# Patient Record
Sex: Male | Born: 1950 | Race: White | Hispanic: No | Marital: Married | Smoking: Never smoker
Health system: Southern US, Community
[De-identification: ages and names within clinical notes are randomized; demographics above are authoritative.]

## PROBLEM LIST (undated history)

## (undated) DIAGNOSIS — I10 Essential (primary) hypertension: Secondary | ICD-10-CM

## (undated) DIAGNOSIS — K219 Gastro-esophageal reflux disease without esophagitis: Secondary | ICD-10-CM

## (undated) DIAGNOSIS — E785 Hyperlipidemia, unspecified: Secondary | ICD-10-CM

## (undated) DIAGNOSIS — E669 Obesity, unspecified: Secondary | ICD-10-CM

## (undated) HISTORY — DX: Gastro-esophageal reflux disease without esophagitis: K21.9

## (undated) HISTORY — DX: Obesity, unspecified: E66.9

## (undated) HISTORY — DX: Hyperlipidemia, unspecified: E78.5

## (undated) HISTORY — DX: Essential (primary) hypertension: I10

## (undated) HISTORY — PX: UMBILICAL HERNIA REPAIR: SHX196

---

## 1975-01-11 HISTORY — PX: LAMINECTOMY: SHX219

## 2010-01-10 HISTORY — PX: COLONOSCOPY: SHX174

## 2011-06-05 ENCOUNTER — Observation Stay: Payer: Self-pay | Admitting: Specialist

## 2011-06-05 LAB — COMPREHENSIVE METABOLIC PANEL
Albumin: 3.9 g/dL (ref 3.4–5.0)
Alkaline Phosphatase: 84 U/L (ref 50–136)
Bilirubin,Total: 0.6 mg/dL (ref 0.2–1.0)
EGFR (African American): >60
EGFR (Non-African Amer.): >60
Osmolality: 283 (ref 275–301)
SGOT(AST): 22 U/L (ref 15–37)
Sodium: 141 mmol/L (ref 136–145)
Total Protein: 7.2 g/dL (ref 6.4–8.2)

## 2011-06-05 LAB — CK TOTAL AND CKMB (NOT AT ARMC)
CK, Total: 219 U/L (ref 35–232)
CK-MB: 1.4 ng/mL (ref 0.5–3.6)

## 2011-06-05 LAB — LIPASE, BLOOD: Lipase: 77 U/L (ref 73–393)

## 2011-06-05 LAB — CBC
HCT: 47.1 % (ref 40.0–52.0)
HGB: 15.6 g/dL (ref 13.0–18.0)
Platelet: 226 10*3/uL (ref 150–440)

## 2011-06-06 LAB — TROPONIN I: Troponin-I: <0.02 ng/mL

## 2013-04-02 ENCOUNTER — Ambulatory Visit: Payer: Self-pay | Admitting: Nurse Practitioner

## 2013-09-23 IMAGING — CT CT HEAD WITHOUT CONTRAST
2 series · 16 of 30 positions shown, 20 images · non-contrast
Comparison: none

REASON FOR EXAM: dizziness
COMMENTS:

PROCEDURE:     CT  - CT HEAD WITHOUT CONTRAST  - June 05, 2011  [DATE]
RESULT:     Comparison:  None
TECHNIQUE: Multiple axial images from the foramen magnum to the vertex were
obtained without IV contrast.

[Series 2: without · axial · non-contrast · 0.44mm/px · z∈[+182,+306]mm · 13 of 31 slices shown, 17 images]
[im 3/31  brain]
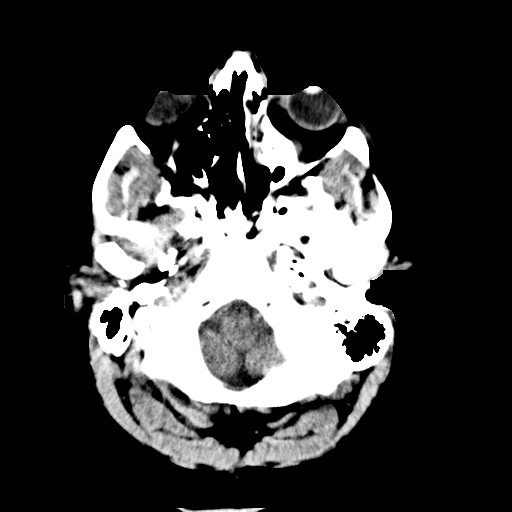
[im 3/31  bone]
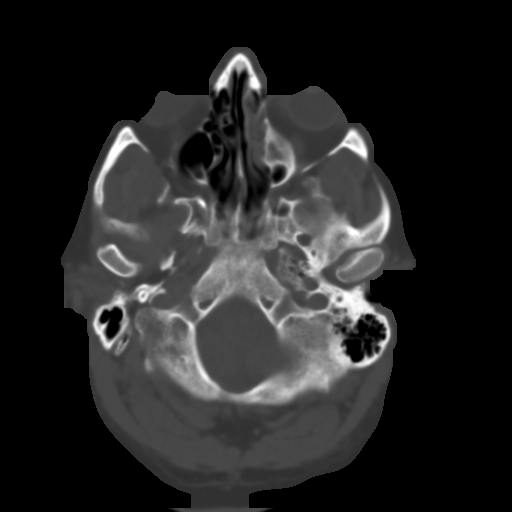
[im 5/31  brain]
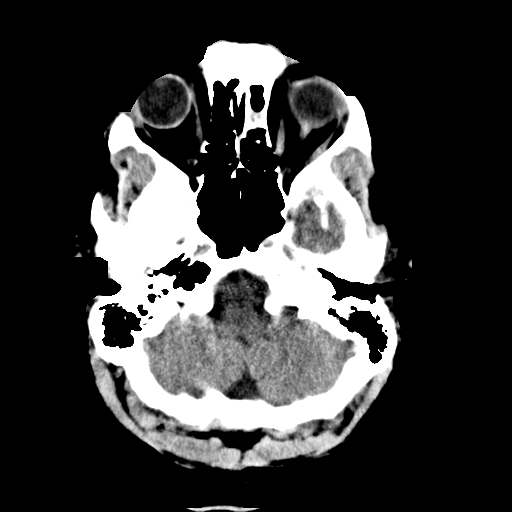
[im 7/31  brain]
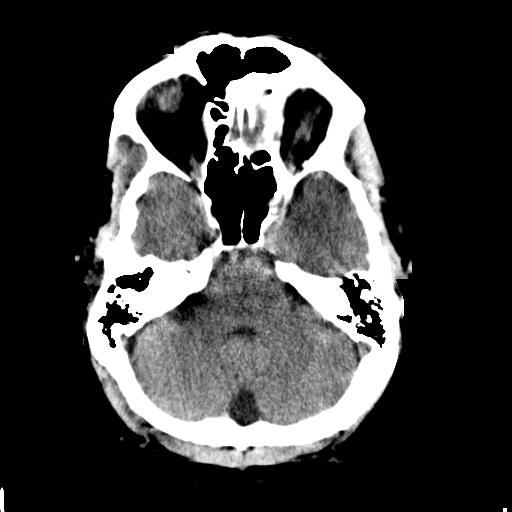
[im 9/31  brain]
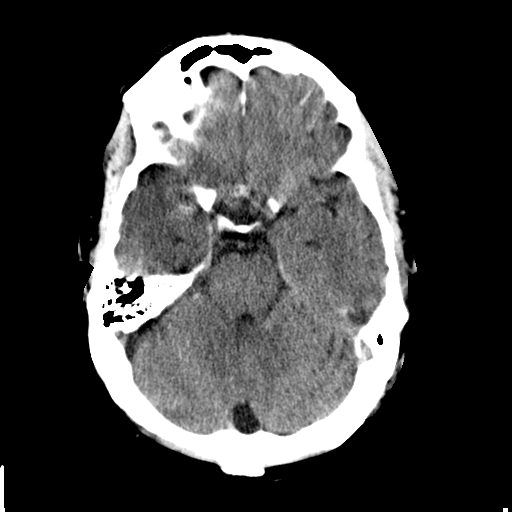
[im 11/31  brain]
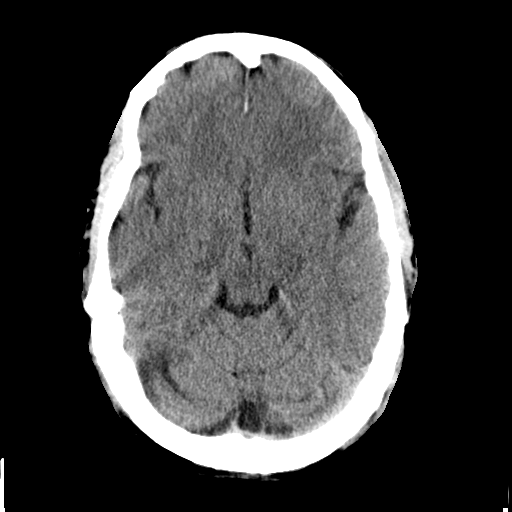
[im 11/31  bone]
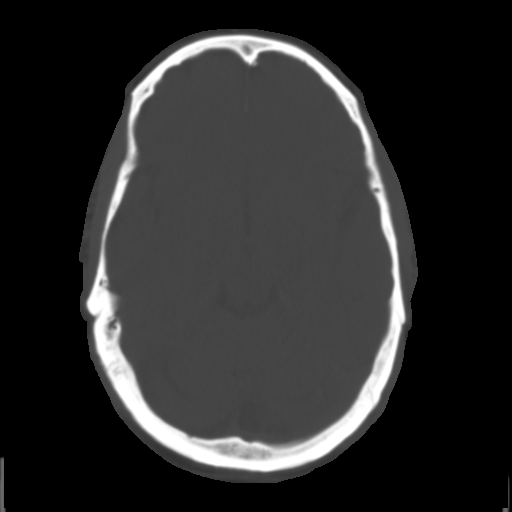
[im 13/31  brain]
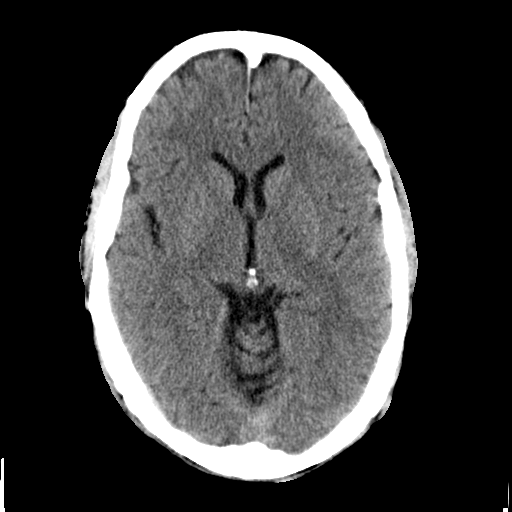
[im 16/31  brain]
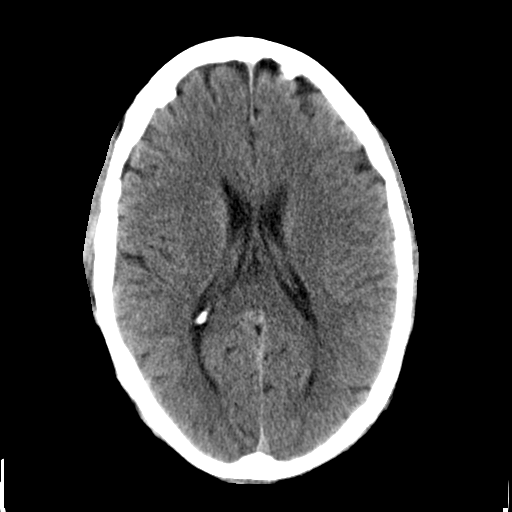
[im 18/31  brain]
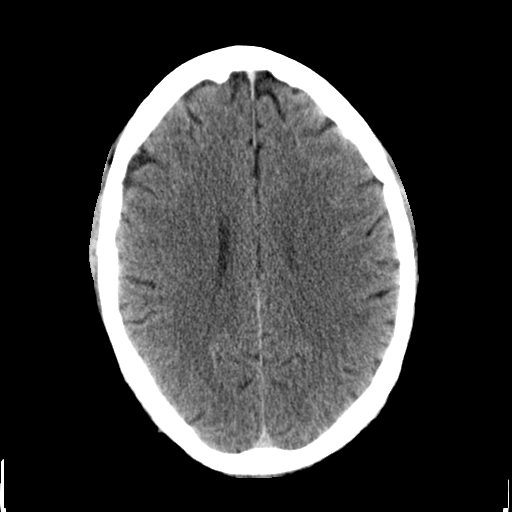
[im 20/31  brain]
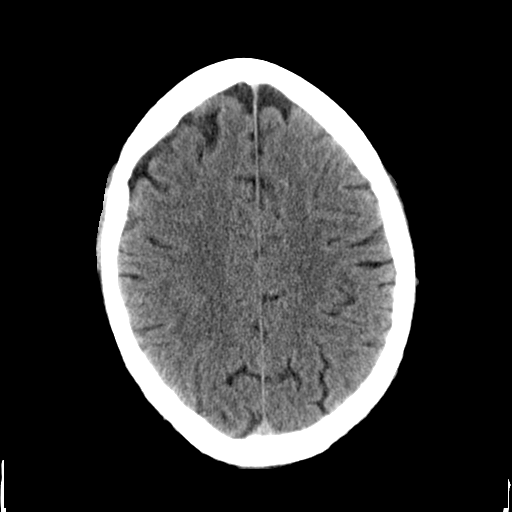
[im 20/31  bone]
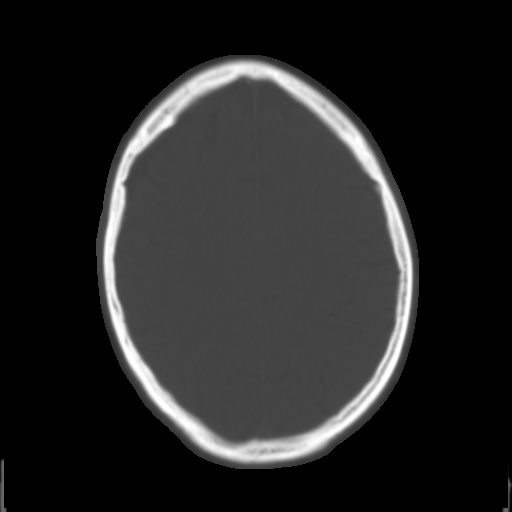
[im 22/31  brain]
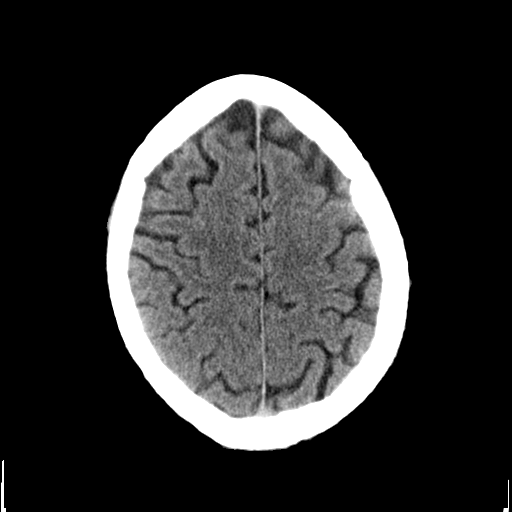
[im 24/31  brain]
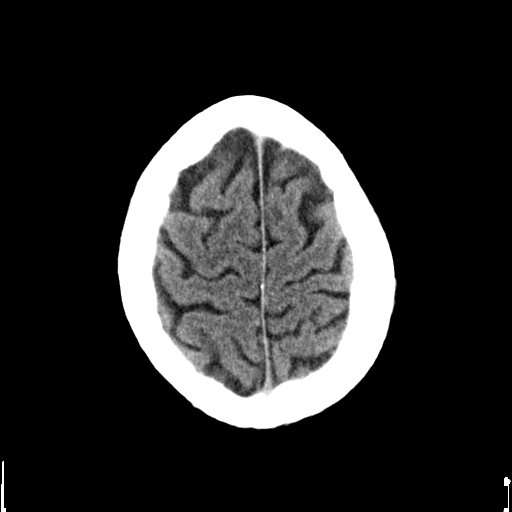
[im 26/31  brain]
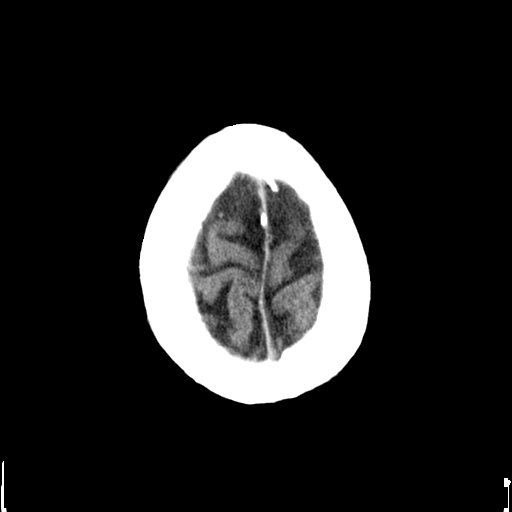
[im 28/31  brain]
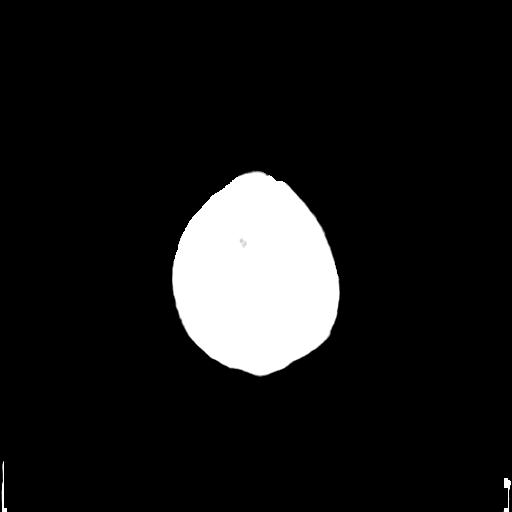
[im 28/31  bone]
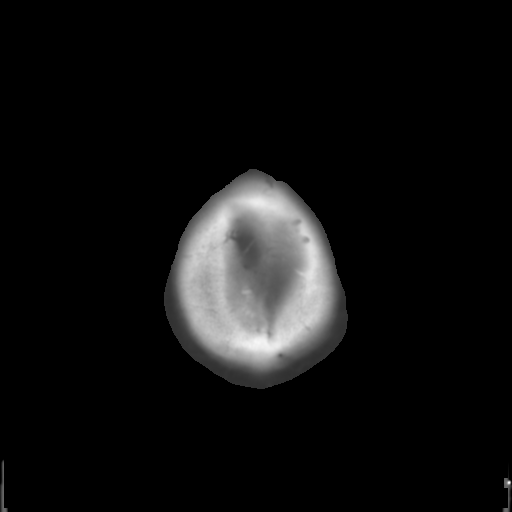

[Series 3: bone · axial · 0.44mm/px · z∈[+182,+222]mm · 3 of 31 slices shown]
[im 3/31  bone]
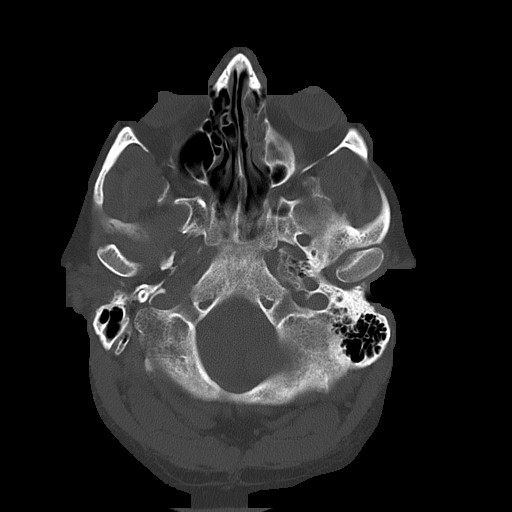
[im 7/31  bone]
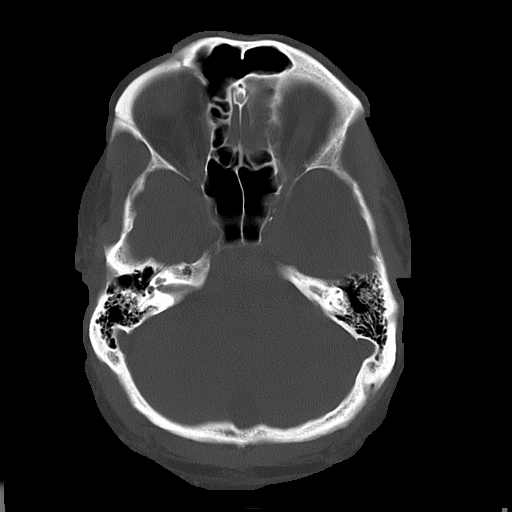
[im 11/31  bone]
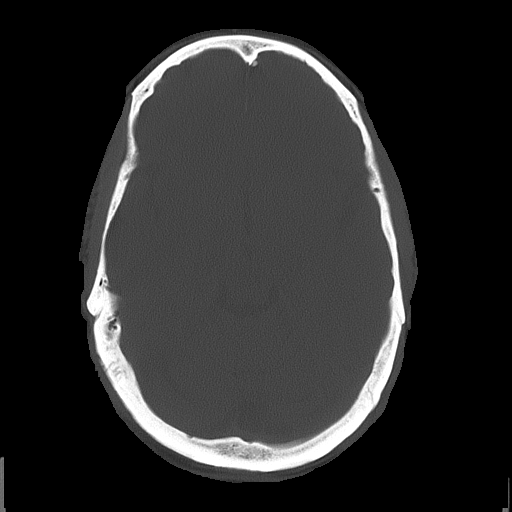

[16 of 30 positions shown; findings below may reference images not displayed]

FINDINGS: There is no evidence for mass effect, midline shift, or extra-axial fluid
collections. There is no evidence for space-occupying lesion, intracranial
hemorrhage, or cortical-based area of infarction.

There is near-complete opacification of the partially visualized left
maxillary sinus. There is medial deviation of the medial wall of the left
maxillary sinus. There is thickening of the walls of the left maxillary
sinus, incompletely visualized. There is mild opacification of the left
ethmoid air cells.

The osseous structures are unremarkable.
IMPRESSION: 1. No acute intracranial process.
2. Chronic left maxillary sinus disease.

## 2013-11-02 ENCOUNTER — Emergency Department: Payer: Self-pay | Admitting: Emergency Medicine

## 2014-05-04 NOTE — H&P (Signed)
PATIENT NAME:  Derek Martin, Derek Martin MR#:  [id number] DATE OF BIRTH:  10/16/1950  DATE OF ADMISSION:  06/05/2011  PRIMARY CARE PHYSICIAN: The patient has no local doctor.   CHIEF COMPLAINT: Near syncope, dizziness, and sweating.   HISTORY OF PRESENT ILLNESS: Derek Martin is a 64 year old pleasant Caucasian male with history of systemic hypertension. He moved from [place] to [place] in March. He was doing well until a few days ago on Thursday when he noticed that he has dizziness associated with sweating that comes and goes and some tingly sensation in the scalp area. This is also associated with near syncope feeling. The patient was noticed to have bradycardia while he is being monitored here. It was in the 40's and at times went down even in the 30's. The patient is on atenolol. The dose was not specified. The patient denies having any chest pain. No shortness of breath. No palpitations. Now being admitted for observation on telemetry.   REVIEW OF SYSTEMS: Denies having any fever. No chills. No fatigue. No other symptoms other than sweating and the dizziness. EYES: No blurring of vision. No double vision. ENT: No hearing impairment. No sore throat. No dysphagia. CARDIOVASCULAR: No chest pain. No shortness of breath. No edema but he has near syncope symptoms. RESPIRATORY: No shortness of breath. No cough. No sputum production. GASTROINTESTINAL: No abdominal pain. No nausea. No vomiting. No diarrhea. GENITOURINARY: No dysuria. No frequency of urination. MUSCULOSKELETAL: No joint pain or swelling. No muscular pain or swelling. INTEGUMENTARY: No skin rash. No ulcers. NEUROLOGY: No focal weakness. No seizure activity. No headache. No ataxia. PSYCHIATRY: No anxiety. No depression. ENDOCRINE: No heat or cold intolerance. No polyuria or polydipsia.   PAST MEDICAL HISTORY:  1. Systemic hypertension.  2. Hypercholesterolemia.   PAST SURGICAL HISTORY:  1. Umbilical hernia repair. 2. Back surgery.     ADMISSION MEDICATIONS:  1. Atenolol. The dose was not specified.  2. Triamterene 1 tablet once a day.   ALLERGIES: No known drug allergies.   SOCIAL HABITS: Nonsmoker. No history of drug or alcohol abuse. He drinks alcohol only rarely.   FAMILY HISTORY: His father died at age of 69 from myocardial infarction. His mother is still living and she has no health problems.   SOCIAL HISTORY: He is married, living with his wife. 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   PHYSICAL EXAMINATION:   VITAL SIGNS: Blood pressure 146/77, respiratory rate 20, pulse 56 and at times in the 30's, temperature 97.9, oxygen saturation 96%.   GENERAL APPEARANCE: Middle-aged male laying in bed in no acute distress, healthy looking.   EARS, NOSE, AND THROAT: Hearing was normal. Nasal mucosa, lips, tongue were normal.   EYES: Normal iris and conjunctivae. Pupils about 5 mm, equal and reactive to light.   NECK: Supple. Trachea at midline. No thyromegaly. No cervical lymphadenopathy. No masses.   HEART: Normal S1, S2. No S3 or S4. No murmur. No gallop. No carotid bruits.   RESPIRATORY: Normal breathing pattern without use of accessory muscles. No rales. No wheezing.   ABDOMEN: Soft without tenderness. No hepatosplenomegaly. No masses. No hernias.   SKIN: No ulcers. No subcutaneous nodules.   MUSCULOSKELETAL: No joint swelling. No clubbing.   NEUROLOGIC: Cranial nerves II through XII are intact. No focal motor deficit.   PSYCHIATRIC: The patient is alert and oriented x3. Mood and affect were normal.   LABORATORY, DIAGNOSTIC, AND RADIOLOGICAL DATA: CAT scan of the head showed no  acute abnormalities.   EKG showed sinus bradycardia at rate of 54 per minute with incomplete right bundle branch block, otherwise unremarkable EKG.  Serum glucose 101, BUN 16, creatinine 0.9, sodium 141, potassium 3.6. Liver function tests were normal. CPK 219. Troponin less than 0.02. CBC  showed white count of 11,000, hemoglobin 15, hematocrit 47, platelet count 226.   IMPRESSION:  1. Dizziness associated with near syncope episodes. 2. Significant bradycardia noted over telemetry to reach 30's but maintaining sinus rhythm.  3. Systemic hypertension, not optimally controlled.  4. Hypercholesterolemia.  5. History of back surgery. 6. Umbilical hernia repair.   PLAN:  1. Will admit the patient for observation over telemetry monitoring.  2. Discontinue atenolol. Will use lisinopril 10 mg a day instead.  3. Continue the diuretic.  4. Follow-up on cardiac enzymes.  5. I advised the patient to find a local primary care physician to follow-up but hopefully the cause of his symptoms is the atenolol. Should his symptoms persist, then we have to look for another cause of  his symptoms.   ____________________________ Clovis Pu. Lenore Manner, MD amd:drc D: 06/05/2011 22:02:28 ET T: 06/06/2011 07:01:09 ET JOB#: [id number]  cc: Clovis Pu. Lenore Manner, MD, <Dictator> Mike Craze Irven Coe MD ELECTRONICALLY SIGNED 06/11/2011 22:08

## 2014-05-04 NOTE — Consult Note (Signed)
Consult dictated. 89 YOWM came with dizziness and sinus bradycardia in 40S, and now has 56/min heart rate. He may have inner ear problem and started meclazine. Will d/c home with f/u office tomorrow 11 am.  Electronic Signatures: Angelica Ran (MD)  (Signed on 27-May-13 10:29)  Authored  Last Updated: 27-May-13 10:29 by Angelica Ran (MD)

## 2014-05-04 NOTE — Discharge Summary (Signed)
PATIENT NAME:  Derek Martin, Derek Martin MR#:  [id number] DATE OF BIRTH:  [birth year]  DATE OF ADMISSION:  06/05/2011 DATE OF DISCHARGE:  06/06/2011  HISTORY AND PHYSICAL: For a detailed note, please take a look at the History and Physical done on admission by Dr. Lenore Manner.   DIAGNOSES AT DISCHARGE:  1. Dizziness likely suspected due to underlying bradycardia from atenolol, complicated with possible underlying vertigo. 2. Sinus bradycardia, likely related to atenolol.  3. Hypertension.   DIET: The patient is being discharged on a low-sodium diet.   ACTIVITY: As tolerated.   FOLLOWUP: Follow up with Dr. Neoma Laming in the next 1 to 2 weeks.   DISCHARGE MEDICATIONS:  1. Triamterene HCTZ 37.5/25, 1 tab daily.  2. Aspirin 81 mg daily.  3. Lisinopril 10 mg daily.   NOTE: The patient is being told to discontinue his atenolol.   PERTINENT LABORATORY, DIAGNOSTIC AND RADIOLOGICAL DATA:  CT scan of the head: No acute intracranial process. Chronic left maxillary sinus disease.  Chest x-ray showing no acute significant abnormalities.   HOSPITAL COURSE: The patient is a 64 year old male with medical problems as mentioned above who presented to the hospital secondary to dizziness and was noted to be bradycardic.   1. Bradycardia: The patient had significant bradycardia with heart rates going down to as low as the high 30s to low 40s. The patient apparently was on atenolol, and this was likely the culprit for his bradycardia. The patient was seen in consultation by Cardiology by Dr. Neoma Laming, who agreed with the fact that the atenolol was probably causing his heart rate to be down. The patient has been told to discontinue his atenolol. He is presently hemodynamically stable but still continues to complain of dizziness, but unlikely all of this is related to his bradycardia.  2. Dizziness: This is likely multifactorial in nature, likely complicated with the use of atenolol with some bradycardia and  possible vertigo. The patient is being discharged on some meclizine. His dizziness should improve as he stays off the atenolol for the next few days. He will have a followup with Dr. Neoma Laming coming up soon. The patient does not have a primary care physician as he has recently moved here, but Dr. Humphrey Rolls is going to refer him to a primary care physician in his office.  3. Hypertension: As mentioned, the patient is hemodynamically stable. He did not have any evidence of orthostasis. He is being told to discontinue his atenolol. He was discharged on some lisinopril and continuation of his triamterene.   CODE STATUS: The patient is a FULL CODE.  TIME SPENT: 35 minutes.   ____________________________ Belia Heman. Verdell Carmine, MD vjs:cbb D: 06/06/2011 15:18:10 ET T: 06/07/2011 10:31:52 ET JOB#: [id number]  cc: Belia Heman. Verdell Carmine, MD, <Dictator> Dionisio David, MD Henreitta Leber MD ELECTRONICALLY SIGNED 06/07/2011 15:29

## 2014-05-04 NOTE — Consult Note (Signed)
PATIENT NAME:  Derek Martin, Derek Martin MR#:  [id number] DATE OF BIRTH:  [birth year]  DATE OF CONSULTATION:  06/06/2011  REFERRING PHYSICIAN:   CONSULTING PHYSICIAN:  Dionisio David, MD  HISTORY OF PRESENT ILLNESS: This is a 64 year old white male with a past medical history of hypertension and hypercholesterolemia who moved from [place] to [place] in March. He came in because of dizziness and sweating. He initially had a heart rate in the 30's and then went into 40's, now intermittently his heart rate is between 40 to 55. He is still having some intermittent dizziness. He has ruled out for myocardial infarction.   PAST MEDICAL HISTORY:  1. Hypertension.  2. Hyperlipidemia.   MEDICATIONS:  1. Atenolol.  2. Triamterene.   ALLERGIES: None.   SOCIAL HISTORY: Unremarkable.   FAMILY HISTORY: History of premature coronary artery disease.   SOCIAL HISTORY: Unremarkable.   PHYSICAL EXAMINATION:   GENERAL: He is alert, oriented x3 in no acute distress.   VITAL SIGNS: Pulse right now is 58, respirations 18, blood pressure 143/68.   NECK: No JVD.   LUNGS: Clear.   HEART: Regular rate and rhythm. Normal S1, S2. No audible murmur.   ABDOMEN: Soft, nontender. Positive bowel sounds.   EXTREMITIES: No pedal edema.   LABORATORY, DIAGNOSTIC, AND RADIOLOGICAL DATA: EKG shows sinus bradycardia, 54 beats per minute, incomplete right bundle branch block, nonspecific ST-T changes. Cardiac enzymes are negative.   ASSESSMENT AND PLAN: Sinus bradycardia, dizziness, wondering whether this is due to vertigo versus bradycardia but atenolol last time he took was yesterday. It's going to linger on for at least 48 hours. Agree with starting lisinopril for blood pressure and he can be discharged with a follow-up in the office tomorrow at 11 o'clock .  Thank you very much for the referral.   ____________________________ Dionisio David, MD sak:drc D: 06/06/2011 10:26:39 ET T: 06/06/2011 10:47:19  ET JOB#: [id number]  cc: Dionisio David, MD, <Dictator> Dionisio David MD ELECTRONICALLY SIGNED 06/20/2011 15:45

## 2015-01-11 HISTORY — PX: CARPAL TUNNEL RELEASE: SHX101

## 2015-07-22 IMAGING — CR DG CHEST 2V
1 series · 2 of 2 positions shown · non-contrast
Comparison: June 05, 2011

CLINICAL DATA: Nonproductive cough.

EXAM:
CHEST  2 VIEW

[Series 1: pa · 0.17mm/px · 2 of 2 slices shown]
[im 1/2]
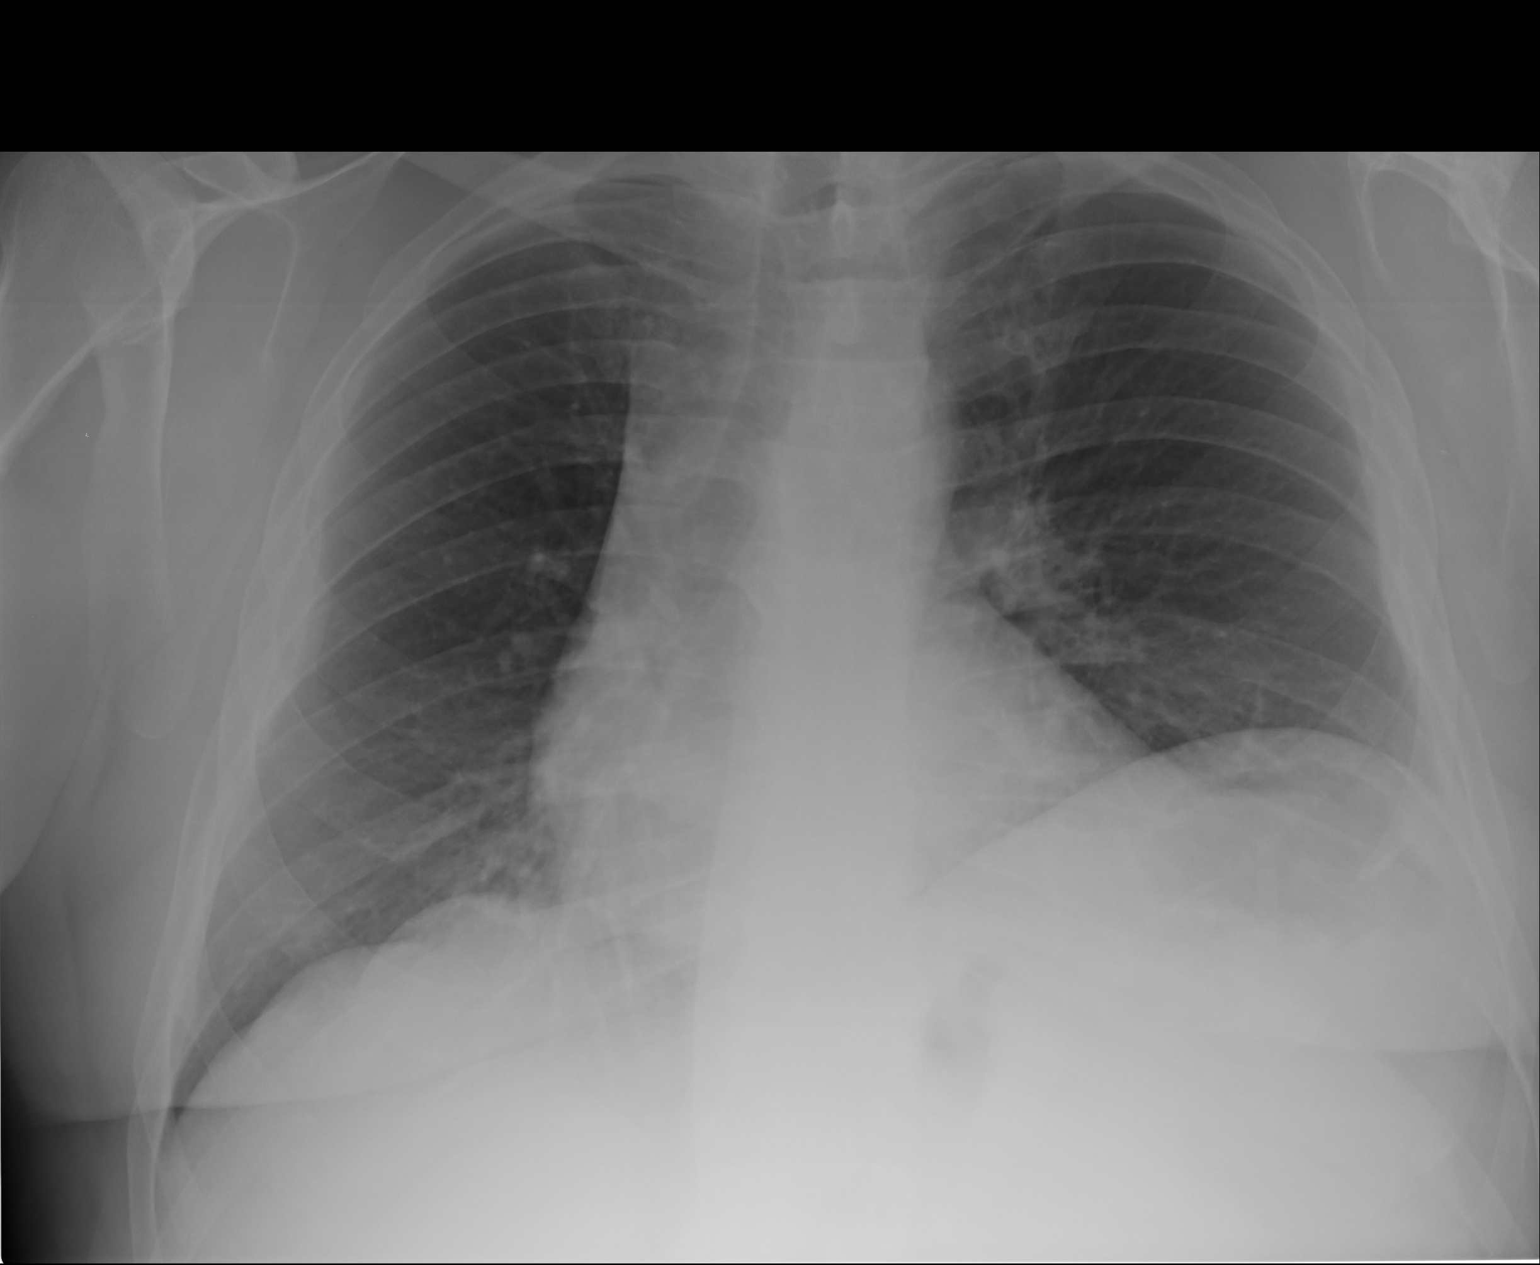
[im 2/2]
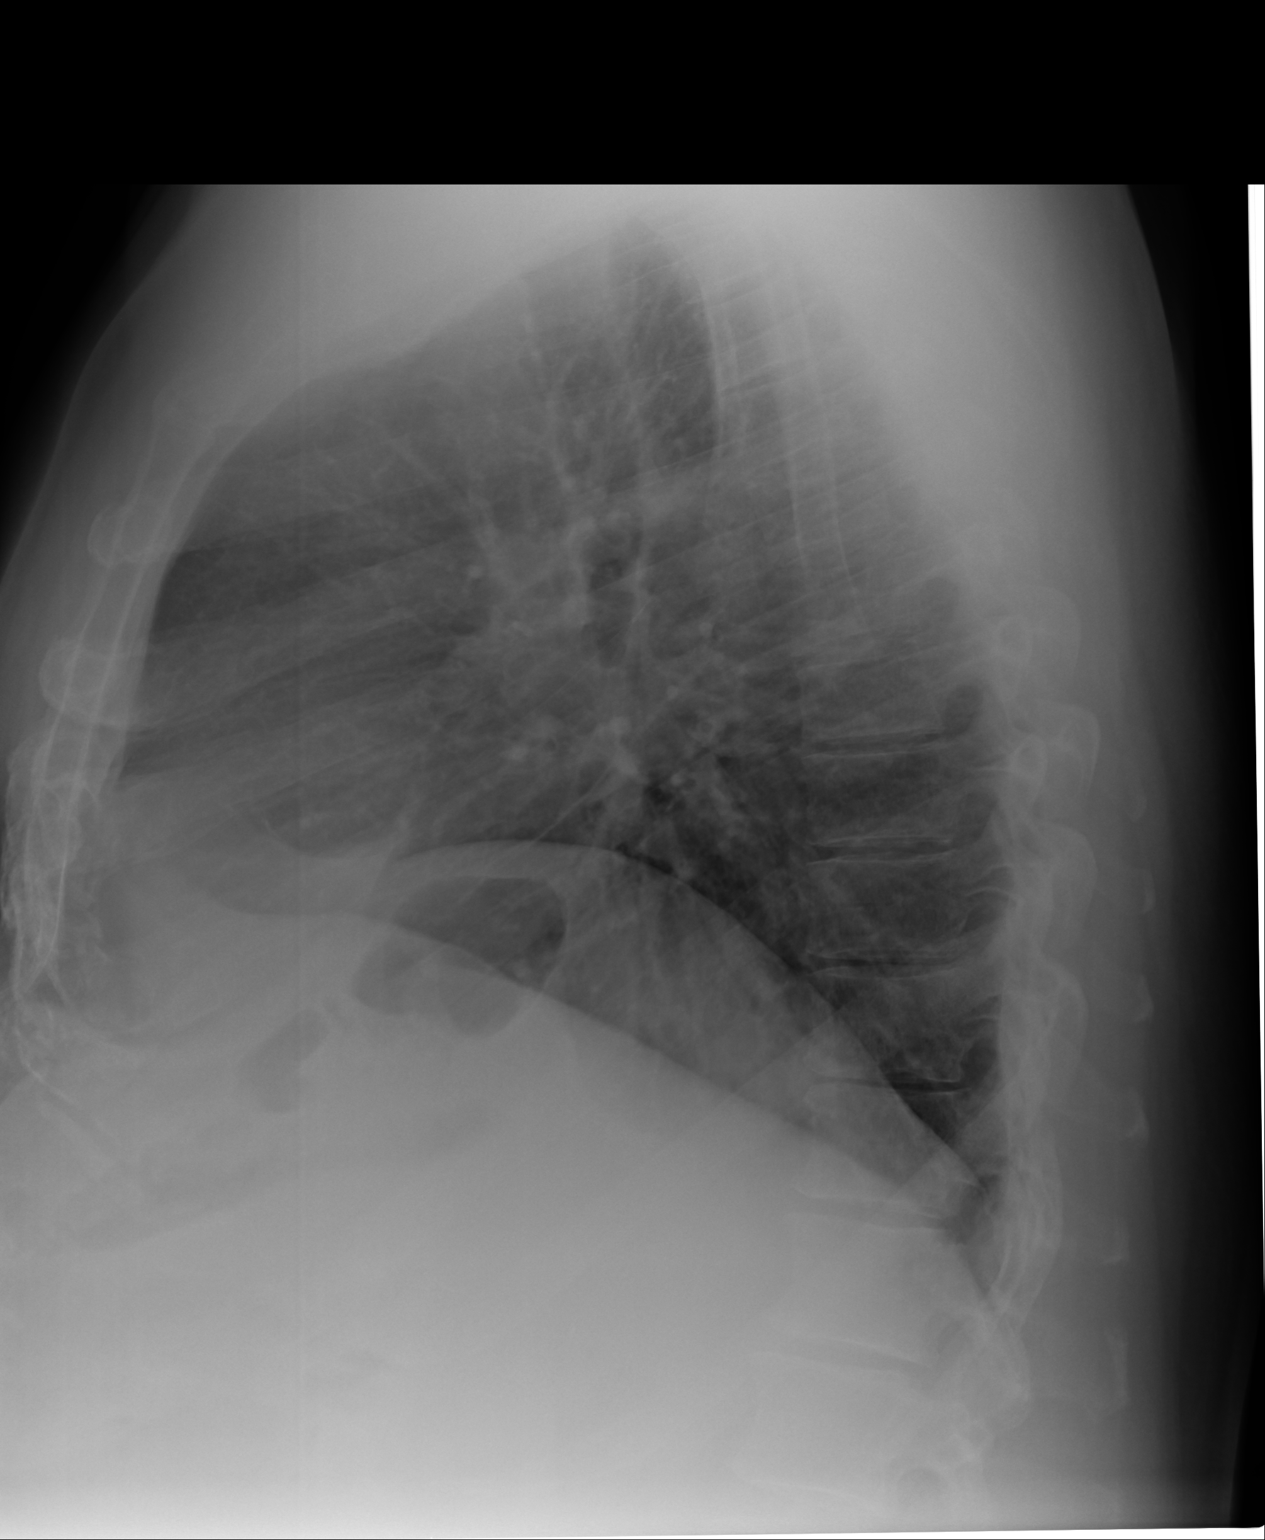

[2 of 2 positions shown; findings below may reference images not displayed]

FINDINGS: The heart size and mediastinal contours are within normal limits.
Both lungs are clear. There is stable chronic elevation of the left
hemidiaphragm. The visualized skeletal structures are unremarkable.
IMPRESSION: No active cardiopulmonary disease.

## 2016-12-06 ENCOUNTER — Encounter: Payer: Self-pay | Admitting: Internal Medicine

## 2017-11-08 ENCOUNTER — Ambulatory Visit (INDEPENDENT_AMBULATORY_CARE_PROVIDER_SITE_OTHER): Payer: BLUE CROSS/BLUE SHIELD | Admitting: Gastroenterology

## 2017-11-08 VITALS — Ht 74.0 in | Wt 313.0 lb

## 2017-11-08 DIAGNOSIS — Z86010 Personal history of colonic polyps: Secondary | ICD-10-CM

## 2017-11-08 MED ORDER — PEG-KCL-NACL-NASULF-NA ASC-C 140 G PO SOLR: 140.0000 g | ORAL | 0 refills | Status: DC

## 2017-11-08 NOTE — Progress Notes (Signed)
[facility] Consult Note:  History: Derek Martin 11/08/2017  Referring physician: Jani Gravel, MD  Reason for consult/chief complaint: No chief complaint on file.   Subjective  HPI:  This patient was referred to [place] for history of colon polyps.  An office visit was made, though he has no symptoms.  He denies abdominal pain, rectal bleeding or altered bowel habits.  He denies chronic heartburn, dysphagia or odynophagia.  Records show a colonoscopy in February 2012 complete to the cecum with excellent preparation.  5 polyps, all less than 10 mm in diameter, were removed from various locations.  3 of them were adenomas, the other 2 hyperplastic.   ROS:  Review of Systems   Past Medical History: Past Medical History:  Diagnosis Date  . Hyperlipidemia   . Obesity      Past Surgical History:    Family History: Family History  Problem Relation Age of Onset  . Arthritis Mother   . Heart attack Father   . Testicular cancer Brother     Social History: Social History   Socioeconomic History  . Marital status: Married    Spouse name: Not on file  . Number of children: 2  . Years of education: Not on file  . Highest education level: Not on file  Occupational History  . 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 Financial resource strain: Not on file  . Food insecurity:    Worry: Not on file    Inability: Not on file  . Transportation needs:    Medical: Not on file    Non-medical: Not on file  Tobacco Use  . Smoking status: Never Smoker  . Smokeless tobacco: Never Used  Substance and Sexual Activity  . Alcohol use: Yes    Comment: occ  . Drug use: Not on file  . Sexual activity: Not on file  Lifestyle  . Physical activity:    Days per week: Not on file    Minutes per session: Not on file  . Stress: Not on file  Relationships  . Social connections:    Talks on phone: Not on file    Gets together: Not on  file    Attends religious service: Not on file    Active member of club or organization: Not on file    Attends meetings of clubs or organizations: Not on file    Relationship status: Not on file  Other Topics Concern  . Not on file  Social History Narrative  . Not on file    Allergies: Allergies no known allergies  Outpatient Meds: Current Outpatient Medications  Medication Sig Dispense Refill  . aspirin EC 81 MG tablet Take 81 mg by mouth daily.    Marland Kitchen atenolol (TENORMIN) 50 MG tablet Take 50 mg by mouth daily.    . Colchicine 0.6 MG CAPS Take 0.6 mg by mouth daily.    . rosuvastatin (CRESTOR) 20 MG tablet Take 20 mg by mouth daily.    Marland Kitchen PEG-KCl-NaCl-NaSulf-Na Asc-C (PLENVU) 140 g SOLR Take 140 g by mouth as directed. 1 each 0   No current facility-administered medications for this visit.       ___________________________________________________________________ Objective   Exam:  Ht 6\' 2"  (1.88 m)   Wt (!) 313 lb (142 kg)   BMI 40.19 kg/m    ENT: oral mucosa moist without lesions, no cervical or supraclavicular lymphadenopathy  CV: RRR without murmur, S1/S2, no JVD, no  peripheral edema  Resp: clear to auscultation bilaterally, normal RR and effort noted  GI: soft, no tenderness, with active bowel sounds. No guarding or palpable organomegaly noted, noted by body habitus.   Records as above Assessment: Encounter Diagnosis  Name Primary?  . Personal history of colonic polyps Yes   He is due for surveillance colonoscopy.  He preferred a date in late December or January due to his work schedule.  He was agreeable after discussion of procedure and risks.   Nelida Meuse III  CC: Jani Gravel, MD

## 2017-11-08 NOTE — Patient Instructions (Addendum)
If you are age 66 or older, your body mass index should be between 23-30. Your There is no height or weight on file to calculate BMI. If this is out of the aforementioned range listed, please consider follow up with your Primary Care Provider.  If you are age 74 or younger, your body mass index should be between 19-25. Your There is no height or weight on file to calculate BMI. If this is out of the aformentioned range listed, please consider follow up with your Primary Care Provider.   You have been scheduled for a colonoscopy. Please follow written instructions given to you at your visit today.  Please pick up your prep supplies at the pharmacy within the next 1-3 days. If you use inhalers (even only as needed), please bring them with you on the day of your procedure. Your physician has requested that you go to www.startemmi.com and enter the access code given to you at your visit today. This web site gives a general overview about your procedure. However, you should still follow specific instructions given to you by our office regarding your preparation for the procedure.     It was a pleasure to see you today!  Dr. Loletha Carrow

## 2018-01-08 ENCOUNTER — Ambulatory Visit (AMBULATORY_SURGERY_CENTER): Payer: BLUE CROSS/BLUE SHIELD | Admitting: Gastroenterology

## 2018-01-08 ENCOUNTER — Encounter: Payer: Self-pay | Admitting: Gastroenterology

## 2018-01-08 VITALS — BP 125/60 | HR 44 | Temp 97.5°F | Resp 18 | Ht 74.0 in | Wt 313.0 lb

## 2018-01-08 DIAGNOSIS — Z860100 Personal history of colon polyps, unspecified: Secondary | ICD-10-CM

## 2018-01-08 DIAGNOSIS — D123 Benign neoplasm of transverse colon: Secondary | ICD-10-CM

## 2018-01-08 DIAGNOSIS — K635 Polyp of colon: Secondary | ICD-10-CM

## 2018-01-08 DIAGNOSIS — D120 Benign neoplasm of cecum: Secondary | ICD-10-CM

## 2018-01-08 MED ORDER — SODIUM CHLORIDE 0.9 % IV SOLN: 500.0000 mL | Freq: Once | INTRAVENOUS | Status: DC

## 2018-01-08 NOTE — Progress Notes (Signed)
PT taken to PACU. Monitors in place. VSS. Report given to RN. 

## 2018-01-08 NOTE — Patient Instructions (Addendum)
Handout on polyps, diverticulosis given.  YOU HAD AN ENDOSCOPIC PROCEDURE TODAY AT [facility]:   Refer to the procedure report that was given to you for any specific questions about what was found during the examination.  If the procedure report does not answer your questions, please call your gastroenterologist to clarify.  If you requested that your care partner not be given the details of your procedure findings, then the procedure report has been included in a sealed envelope for you to review at your convenience later.  YOU SHOULD EXPECT: Some feelings of bloating in the abdomen. Passage of more gas than usual.  Walking can help get rid of the air that was put into your GI tract during the procedure and reduce the bloating. If you had a lower endoscopy (such as a colonoscopy or flexible sigmoidoscopy) you may notice spotting of blood in your stool or on the toilet paper. If you underwent a bowel prep for your procedure, you may not have a normal bowel movement for a few days.  Please Note:  You might notice some irritation and congestion in your nose or some drainage.  This is from the oxygen used during your procedure.  There is no need for concern and it should clear up in a day or so.  SYMPTOMS TO REPORT IMMEDIATELY:   Following lower endoscopy (colonoscopy or flexible sigmoidoscopy):  Excessive amounts of blood in the stool  Significant tenderness or worsening of abdominal pains  Swelling of the abdomen that is new, acute  Fever of 100F or higher   For urgent or emergent issues, a gastroenterologist can be reached at any hour by calling [phone].   DIET:  We do recommend a small meal at first, but then you may proceed to your regular diet.  Drink plenty of fluids but you should avoid alcoholic beverages for 24 hours.  ACTIVITY:  You should plan to take it easy for the rest of today and you should NOT DRIVE or use heavy machinery until tomorrow (because of the  sedation medicines used during the test).    FOLLOW UP: Our staff will call the number listed on your records the next business day following your procedure to check on you and address any questions or concerns that you may have regarding the information given to you following your procedure. If we do not reach you, we will leave a message.  However, if you are feeling well and you are not experiencing any problems, there is no need to return our call.  We will assume that you have returned to your regular daily activities without incident.  If any biopsies were taken you will be contacted by phone or by letter within the next 1-3 weeks.  Please call us at [phone] if you have not heard about the biopsies in 3 weeks.    SIGNATURES/CONFIDENTIALITY: You and/or your care partner have signed paperwork which will be entered into your electronic medical record.  These signatures attest to the fact that that the information above on your After Visit Summary has been reviewed and is understood.  Full responsibility of the confidentiality of this discharge information lies with you and/or your care-partner.

## 2018-01-08 NOTE — Op Note (Signed)
[place] Patient Name: Derek Martin Procedure Date: 01/08/2018 8:42 AM MRN: [id number] Endoscopist: [place]. Loletha Carrow , MD Age: 67 Referring MD:  Date of Birth: March 15, 1950 Gender: Male Account #: [id number] Procedure:                Colonoscopy Indications:              Surveillance: Personal history of adenomatous                            polyps on last colonoscopy > 5 years ago (TA x 2 <                            23mm; 02/2010) Medicines:                Monitored Anesthesia Care Procedure:                Pre-Anesthesia Assessment:                           - Prior to the procedure, a History and Physical                            was performed, and patient medications and                            allergies were reviewed. The patient's tolerance of                            previous anesthesia was also reviewed. The risks                            and benefits of the procedure and the sedation                            options and risks were discussed with the patient.                            All questions were answered, and informed consent                            was obtained. Anticoagulants: The patient has taken                            aspirin. It was decided not to withhold this                            medication prior to the procedure. ASA Grade                            Assessment: III - A patient with severe systemic                            disease. After reviewing the risks and benefits,  the patient was deemed in satisfactory condition to                            undergo the procedure.                           After obtaining informed consent, the colonoscope                            was passed under direct vision. Throughout the                            procedure, the patient's blood pressure, pulse, and                            oxygen saturations were monitored continuously. The           Colonoscope was introduced through the anus and                            advanced to the the cecum, identified by                            appendiceal orifice and ileocecal valve. The                            colonoscopy was performed without difficulty. The                            patient tolerated the procedure well. The quality                            of the bowel preparation was fair. The ileocecal                            valve, appendiceal orifice, and rectum were                            photographed. The bowel preparation used was Plenvu. Scope In: 8:49:47 AM Scope Out: 9:36:35 AM Scope Withdrawal Time: 0 hours 43 minutes 46 seconds  Total Procedure Duration: 0 hours 46 minutes 48 seconds  Findings:                 The perianal and digital rectal examinations were                            normal.                           A 10 mm polyp was found in the cecum. The polyp was                            flat. The polyp was removed with a cold snare. The  polyp was removed with a (1cc) saline                            injection-lift technique using a cold snare.                            Resection and retrieval were complete. (Jar 1)                           Four sessile polyps were found in the transverse                            colon. The polyps were 6 to 8 mm in size. These                            polyps were removed with a cold snare. Resection                            and retrieval were complete. Stephanie Coup)                           A 4 mm polyp was found in the transverse colon. The                            polyp was sessile. The polyp was removed with a                            cold biopsy forceps. Resection and retrieval were                            complete. Stephanie Coup)                           Two sessile polyps were found in the transverse                            colon. The polyps were 4 mm in size. These  polyps                            were removed with a cold snare. Resection and                            retrieval were complete. Enrique Sack)                           Diverticula were found in the sigmoid colon.                           The exam was otherwise without abnormality on                            direct and retroflexion views. Complications:            No immediate complications. Estimated  Blood Loss:     Estimated blood loss was minimal. Impression:               - Preparation of the colon was fair.                           - One 10 mm polyp in the cecum, removed with a cold                            snare and removed using injection-lift and a cold                            snare. Resected and retrieved.                           - Four 6 to 8 mm polyps in the transverse colon,                            removed with a cold snare. Resected and retrieved.                           - One 4 mm polyp in the transverse colon, removed                            with a cold biopsy forceps. Resected and retrieved.                           - Two 4 mm polyps in the transverse colon, removed                            with a cold snare. Resected and retrieved.                           - Diverticulosis in the sigmoid colon.                           - The examination was otherwise normal on direct                            and retroflexion views. Recommendation:           - Patient has a contact number available for                            emergencies. The signs and symptoms of potential                            delayed complications were discussed with the                            patient. Return to normal activities tomorrow.                            Written discharge instructions were provided to the  patient.                           - Resume previous diet.                           - Continue present medications.                           -  Await pathology results.                           - Repeat colonoscopy in 1 year for surveillance due                            to number of polyps and suboptimal bowel                            preparation. Lindsea Olivar L. Loletha Carrow, MD 01/08/2018 9:48:32 AM This report has been signed electronically.

## 2018-01-08 NOTE — Progress Notes (Signed)
Called to room to assist during endoscopic procedure.  Patient ID and intended procedure confirmed with present staff. Received instructions for my participation in the procedure from the performing physician.  

## 2018-01-09 ENCOUNTER — Telehealth: Payer: Self-pay

## 2018-01-09 NOTE — Telephone Encounter (Signed)
  Follow up Call-  Call back number 01/08/2018  Post procedure Call Back phone  # [phone]  Permission to leave phone message Yes  Some recent data might be hidden     Patient questions:  Do you have a fever, pain , or abdominal swelling? No Pain Score  0  Have you tolerated food without any problems? Yes  Have you been able to return to your normal activities? Yes  Do you have any questions about your discharge instructions: Diet   No Medications  No Follow up visit  No  Do you have questions or concerns about your Care? No  Actions: * If pain score is 4 or above: No action needed, pain <4

## 2018-01-15 ENCOUNTER — Encounter: Payer: Self-pay | Admitting: Gastroenterology

## 2018-12-10 ENCOUNTER — Encounter: Payer: Self-pay | Admitting: Gastroenterology
